# Patient Record
Sex: Male | Born: 1990 | Race: White | Hispanic: No | Marital: Single | Smoking: Current every day smoker
Health system: Southern US, Community
[De-identification: ages and names within clinical notes are randomized; demographics above are authoritative.]

---

## 2010-10-01 ENCOUNTER — Emergency Department: Payer: Self-pay | Admitting: Emergency Medicine

## 2011-04-15 ENCOUNTER — Ambulatory Visit: Payer: Self-pay

## 2017-07-04 ENCOUNTER — Emergency Department
Admission: EM | Admit: 2017-07-04 | Discharge: 2017-07-04 | Disposition: A | Discharge status: Home / Self Care | Payer: Medicaid Other | Attending: Emergency Medicine | Admitting: Emergency Medicine

## 2017-07-04 ENCOUNTER — Emergency Department: Payer: Medicaid Other

## 2017-07-04 ENCOUNTER — Other Ambulatory Visit: Payer: Self-pay

## 2017-07-04 ENCOUNTER — Encounter: Payer: Self-pay | Admitting: Emergency Medicine

## 2017-07-04 DIAGNOSIS — Z23 Encounter for immunization: Secondary | ICD-10-CM | POA: Insufficient documentation

## 2017-07-04 DIAGNOSIS — F17210 Nicotine dependence, cigarettes, uncomplicated: Secondary | ICD-10-CM | POA: Insufficient documentation

## 2017-07-04 DIAGNOSIS — Y929 Unspecified place or not applicable: Secondary | ICD-10-CM | POA: Insufficient documentation

## 2017-07-04 DIAGNOSIS — S61310A Laceration without foreign body of right index finger with damage to nail, initial encounter: Secondary | ICD-10-CM | POA: Diagnosis present

## 2017-07-04 DIAGNOSIS — W230XXA Caught, crushed, jammed, or pinched between moving objects, initial encounter: Secondary | ICD-10-CM | POA: Insufficient documentation

## 2017-07-04 DIAGNOSIS — Y939 Activity, unspecified: Secondary | ICD-10-CM | POA: Insufficient documentation

## 2017-07-04 DIAGNOSIS — S61319A Laceration without foreign body of unspecified finger with damage to nail, initial encounter: Secondary | ICD-10-CM

## 2017-07-04 DIAGNOSIS — Y998 Other external cause status: Secondary | ICD-10-CM | POA: Diagnosis not present

## 2017-07-04 DIAGNOSIS — S61309A Unspecified open wound of unspecified finger with damage to nail, initial encounter: Secondary | ICD-10-CM

## 2017-07-04 MED ORDER — TETANUS-DIPHTH-ACELL PERTUSSIS 5-2.5-18.5 LF-MCG/0.5 IM SUSP
0.5000 mL | Freq: Once | INTRAMUSCULAR | Status: AC
Start: 2017-07-04 — End: 2017-07-04
  Administered 2017-07-04: 0.5 mL via INTRAMUSCULAR
  Filled 2017-07-04: qty 0.5

## 2017-07-04 MED ORDER — HYDROCODONE-ACETAMINOPHEN 5-325 MG PO TABS
1.0000 | ORAL_TABLET | Freq: Four times a day (QID) | ORAL | 0 refills | Status: DC | PRN
Start: 2017-07-04 — End: 2019-10-21

## 2017-07-04 MED ORDER — PENTAFLUOROPROP-TETRAFLUOROETH EX AERO: 1.0000 "application " | INHALATION_SPRAY | CUTANEOUS | Status: DC | PRN

## 2017-07-04 MED ORDER — LIDOCAINE VISCOUS 2 % MT SOLN
15.0000 mL | Freq: Once | OROMUCOSAL | Status: AC
  Administered 2017-07-04: 15 mL via OROMUCOSAL
  Filled 2017-07-04: qty 15

## 2017-07-04 MED ORDER — LIDOCAINE HCL (PF) 1 % IJ SOLN
5.0000 mL | Freq: Once | INTRAMUSCULAR | Status: AC
  Administered 2017-07-04: 5 mL
  Filled 2017-07-04: qty 5

## 2017-07-04 MED ORDER — HYDROCODONE-ACETAMINOPHEN 5-325 MG PO TABS
1.0000 | ORAL_TABLET | Freq: Once | ORAL | Status: AC
Start: 2017-07-04 — End: 2017-07-04
  Administered 2017-07-04: 1 via ORAL
  Filled 2017-07-04: qty 1

## 2017-07-04 NOTE — ED Triage Notes (Signed)
Pt with right finger # 2 injury. Bleeding controlled.
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
## 2017-07-04 NOTE — Discharge Instructions (Signed)
You have suffered a traumatic nail avulsion and nailbed injury. Keep the wound & dressing clean and dry. Wash only with soap & water. Change the dressing as needed. Take the pain medicine as needed. [facility] or return as needed.
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
## 2019-10-21 ENCOUNTER — Other Ambulatory Visit: Payer: Self-pay

## 2019-10-21 ENCOUNTER — Emergency Department: Payer: Medicaid Other

## 2019-10-21 ENCOUNTER — Emergency Department
Admission: EM | Admit: 2019-10-21 | Discharge: 2019-10-21 | Disposition: A | Discharge status: Home / Self Care | Payer: Medicaid Other | Attending: Emergency Medicine | Admitting: Emergency Medicine

## 2019-10-21 ENCOUNTER — Encounter: Payer: Self-pay | Admitting: Emergency Medicine

## 2019-10-21 DIAGNOSIS — Y999 Unspecified external cause status: Secondary | ICD-10-CM | POA: Insufficient documentation

## 2019-10-21 DIAGNOSIS — S0992XA Unspecified injury of nose, initial encounter: Secondary | ICD-10-CM | POA: Diagnosis present

## 2019-10-21 DIAGNOSIS — F17210 Nicotine dependence, cigarettes, uncomplicated: Secondary | ICD-10-CM | POA: Insufficient documentation

## 2019-10-21 DIAGNOSIS — S022XXA Fracture of nasal bones, initial encounter for closed fracture: Secondary | ICD-10-CM | POA: Diagnosis not present

## 2019-10-21 DIAGNOSIS — Y929 Unspecified place or not applicable: Secondary | ICD-10-CM | POA: Diagnosis not present

## 2019-10-21 DIAGNOSIS — Z23 Encounter for immunization: Secondary | ICD-10-CM | POA: Insufficient documentation

## 2019-10-21 DIAGNOSIS — Y939 Activity, unspecified: Secondary | ICD-10-CM | POA: Insufficient documentation

## 2019-10-21 DIAGNOSIS — T07XXXA Unspecified multiple injuries, initial encounter: Secondary | ICD-10-CM

## 2019-10-21 MED ORDER — METHOCARBAMOL 500 MG PO TABS: 500.0000 mg | ORAL_TABLET | Freq: Three times a day (TID) | ORAL | 0 refills | Status: AC | PRN

## 2019-10-21 MED ORDER — TETANUS-DIPHTH-ACELL PERTUSSIS 5-2.5-18.5 LF-MCG/0.5 IM SUSP
0.5000 mL | Freq: Once | INTRAMUSCULAR | Status: AC
  Administered 2019-10-21: 14:00:00 0.5 mL via INTRAMUSCULAR
  Filled 2019-10-21: qty 0.5

## 2019-10-21 MED ORDER — AMOXICILLIN 875 MG PO TABS: 875.0000 mg | ORAL_TABLET | Freq: Two times a day (BID) | ORAL | 0 refills | Status: DC

## 2019-10-21 MED ORDER — NAPROXEN 500 MG PO TABS: 500.0000 mg | ORAL_TABLET | Freq: Two times a day (BID) | ORAL | 0 refills | Status: DC

## 2019-10-21 MED ORDER — KETOROLAC TROMETHAMINE 60 MG/2ML IM SOLN
15.0000 mg | Freq: Once | INTRAMUSCULAR | Status: AC
  Administered 2019-10-21: 14:00:00 15 mg via INTRAMUSCULAR
  Filled 2019-10-21: qty 2
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
## 2019-10-21 NOTE — Discharge Instructions (Signed)
Your CT scans show nasal bone fractures.  You should take amoxicillin 2 times a day to protect against infection while this starts to heal.  Follow-up with ENT in 1 week for further evaluation.  There are no other serious injuries, and your x-rays of the low back and left hand do not show any broken bones.
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
## 2019-10-22 ENCOUNTER — Emergency Department
Admission: EM | Admit: 2019-10-22 | Discharge: 2019-10-22 | Disposition: A | Discharge status: Home / Self Care | Payer: Medicaid Other | Attending: Emergency Medicine | Admitting: Emergency Medicine

## 2019-10-22 ENCOUNTER — Other Ambulatory Visit: Payer: Self-pay

## 2019-10-22 ENCOUNTER — Encounter: Payer: Self-pay | Admitting: Emergency Medicine

## 2019-10-22 DIAGNOSIS — S51011A Laceration without foreign body of right elbow, initial encounter: Secondary | ICD-10-CM | POA: Diagnosis not present

## 2019-10-22 DIAGNOSIS — S59911A Unspecified injury of right forearm, initial encounter: Secondary | ICD-10-CM | POA: Diagnosis present

## 2019-10-22 DIAGNOSIS — F17210 Nicotine dependence, cigarettes, uncomplicated: Secondary | ICD-10-CM | POA: Diagnosis not present

## 2019-10-22 DIAGNOSIS — S51811A Laceration without foreign body of right forearm, initial encounter: Secondary | ICD-10-CM

## 2019-10-22 DIAGNOSIS — Y999 Unspecified external cause status: Secondary | ICD-10-CM | POA: Diagnosis not present

## 2019-10-22 DIAGNOSIS — W25XXXA Contact with sharp glass, initial encounter: Secondary | ICD-10-CM | POA: Diagnosis not present

## 2019-10-22 DIAGNOSIS — Y939 Activity, unspecified: Secondary | ICD-10-CM | POA: Insufficient documentation

## 2019-10-22 DIAGNOSIS — Y92009 Unspecified place in unspecified non-institutional (private) residence as the place of occurrence of the external cause: Secondary | ICD-10-CM | POA: Diagnosis not present

## 2019-10-22 MED ORDER — LIDOCAINE HCL (PF) 1 % IJ SOLN
5.0000 mL | Freq: Once | INTRAMUSCULAR | Status: AC
  Administered 2019-10-22: 11:00:00 5 mL via INTRADERMAL
  Filled 2019-10-22: qty 5

## 2019-10-22 MED ORDER — CEPHALEXIN 500 MG PO CAPS: 500.0000 mg | ORAL_CAPSULE | Freq: Four times a day (QID) | ORAL | 0 refills | Status: AC

## 2019-10-22 MED ORDER — CEPHALEXIN 500 MG PO CAPS
500.0000 mg | ORAL_CAPSULE | Freq: Once | ORAL | Status: AC
  Administered 2019-10-22: 12:00:00 500 mg via ORAL
  Filled 2019-10-22: qty 1

## 2019-10-22 MED ORDER — KETOROLAC TROMETHAMINE 30 MG/ML IJ SOLN
15.0000 mg | Freq: Once | INTRAMUSCULAR | Status: AC
  Administered 2019-10-22: 11:00:00 15 mg via INTRAMUSCULAR
  Filled 2019-10-22: qty 1

## 2019-10-22 MED ORDER — SULFAMETHOXAZOLE-TRIMETHOPRIM 800-160 MG PO TABS
1.0000 | ORAL_TABLET | Freq: Once | ORAL | Status: DC
  Filled 2019-10-22: qty 1
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
## 2019-10-22 NOTE — Discharge Instructions (Signed)
Please begin Keflex today to prevent infection.  Do not pick up the amoxicillin that you were prescribed yesterday.  Keep laceration covered.  Do not get sutures wet for 24 hours.  Sutures need to be removed in 7 to 10 days.
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
## 2020-06-01 ENCOUNTER — Ambulatory Visit
Admission: EM | Admit: 2020-06-01 | Discharge: 2020-06-01 | Disposition: A | Discharge status: Other Acute Inpatient Hospital | Payer: Medicaid Other

## 2020-06-01 ENCOUNTER — Other Ambulatory Visit: Payer: Self-pay

## 2020-06-01 DIAGNOSIS — K047 Periapical abscess without sinus: Secondary | ICD-10-CM | POA: Diagnosis not present

## 2020-06-01 DIAGNOSIS — S025XXB Fracture of tooth (traumatic), initial encounter for open fracture: Secondary | ICD-10-CM

## 2020-06-01 NOTE — ED Provider Notes (Signed)
[facility]    CSN: [id number] Arrival date & time: 06/01/20  1408      History   Chief Complaint Chief Complaint  Patient presents with  . Dental Pain    HPI Eric Chen is a 30 y.o. male.   HPI   30 year old male here for evaluation of dental pain and swelling to the left side of his face.  Patient reports that he developed swelling 2 nights ago.  He also reports that that is when his pain increased.  Patient does have multiple broken teeth on his left lower jaw that have been broken for some time.  Patient denies fever.  Patient is crying in the exam room.  History reviewed. No pertinent past medical history.  There are no problems to display for this patient.   History reviewed. No pertinent surgical history.     Home Medications    Prior to Admission medications   Not on File    Family History History reviewed. No pertinent family history.  Social History Social History   Tobacco Use  . Smoking status: Current Every Day Smoker    Types: Cigarettes  . Smokeless tobacco: Former Clinical biochemist  . Vaping Use: Never used  Substance Use Topics  . Alcohol use: No     Allergies   Patient has no known allergies.   Review of Systems Review of Systems  Constitutional: Negative for activity change, appetite change and fever.  HENT: Positive for dental problem and ear pain.   Skin: Negative for rash and wound.  Hematological: Negative.   Psychiatric/Behavioral: Negative.      Physical Exam Triage Vital Signs ED Triage Vitals  Enc Vitals Group     BP 06/01/20 1434 (!) 156/102     Pulse Rate 06/01/20 1434 (!) 105     Resp 06/01/20 1434 18     Temp 06/01/20 1434 99.1 F (37.3 C)     Temp Source 06/01/20 1434 Oral     SpO2 06/01/20 1434 100 %     Weight 06/01/20 1432 148 lb (67.1 kg)     Height 06/01/20 1432 6\' 2"  (1.88 m)     Head Circumference --      Peak Flow --      Pain Score 06/01/20 1432 10     Pain Loc --      Pain  Edu? --      Excl. in GC? --    No data found.  Updated Vital Signs BP (!) 156/102 (BP Location: Left Arm)   Pulse (!) 105   Temp 99.1 F (37.3 C) (Oral)   Resp 18   Ht 6\' 2"  (1.88 m)   Wt 148 lb (67.1 kg)   SpO2 100%   BMI 19.00 kg/m   Visual Acuity Right Eye Distance:   Left Eye Distance:   Bilateral Distance:    Right Eye Near:   Left Eye Near:    Bilateral Near:     Physical Exam Vitals and nursing note reviewed.  Constitutional:      General: He is in acute distress.     Appearance: Normal appearance. He is not toxic-appearing.  HENT:     Head: Normocephalic and atraumatic.     Mouth/Throat:     Mouth: Mucous membranes are moist.     Pharynx: Posterior oropharyngeal erythema present.  Skin:    General: Skin is warm and dry.     Capillary Refill: Capillary refill takes less than  2 seconds.  Neurological:     General: No focal deficit present.     Mental Status: He is alert and oriented to person, place, and time.  Psychiatric:        Mood and Affect: Mood normal.        Behavior: Behavior normal.        Thought Content: Thought content normal.        Judgment: Judgment normal.      UC Treatments / Results  Labs (all labs ordered are listed, but only abnormal results are displayed) Labs Reviewed - No data to display  EKG   Radiology No results found.  Procedures Procedures (including critical care time)  Medications Ordered in UC Medications - No data to display  Initial Impression / Assessment and Plan / UC Course  I have reviewed the triage vital signs and the nursing notes.  Pertinent labs & imaging results that were available during my care of the patient were reviewed by me and considered in my medical decision making (see chart for details).   Is here for evaluation of swelling to the left side of his face on the lateral aspect of the posterior mandible anterior to the angle.  Patient is with a molar on the lower left is fractured and  eroded.  The tooth bed is yellow.  There other broken teeth anterior to that tooth as well.  Patient has bogginess to the floor of his mouth and is tender to palpation.  There is erythema to the surrounding gum tissue but no discharge.  The swelling on the outside of the jaw is hard but not indurated or fluctuant.  Concern for infection has asked spread to the mandible.  For this reason I have recommended patient go to the ER at [facility] to be evaluated by dental and oral maxillofacial surgery.   Final Clinical Impressions(s) / UC Diagnoses   Final diagnoses:  Dental infection  Open fracture of tooth, initial encounter     Discharge Instructions     Due to the swelling, pain, and degree of dental decay present there is concern that the infection has spread to your jaw bone.  For that reason I have suggested you go to the emergency department at [facility], as we discussed, to be evaluated by the on-call dentist.    ED Prescriptions    None     I have reviewed the PDMP during this encounter.   Becky Augusta, NP 06/01/20 [phone]

## 2020-06-01 NOTE — ED Triage Notes (Signed)
Patient complains of multiple broken lower left teeth since Saturday. States that are has been very sore and worsening with swelling.

## 2020-06-01 NOTE — Discharge Instructions (Addendum)
Due to the swelling, pain, and degree of dental decay present there is concern that the infection has spread to your jaw bone.  For that reason I have suggested you go to the emergency department at [facility], as we discussed, to be evaluated by the on-call dentist.

## 2021-01-14 ENCOUNTER — Emergency Department (HOSPITAL_COMMUNITY)
Admission: EM | Admit: 2021-01-14 | Discharge: 2021-01-23 | Disposition: E | Payer: Medicaid Other | Attending: Emergency Medicine | Admitting: Emergency Medicine

## 2021-01-14 DIAGNOSIS — S2191XA Laceration without foreign body of unspecified part of thorax, initial encounter: Secondary | ICD-10-CM | POA: Insufficient documentation

## 2021-01-14 DIAGNOSIS — Y92410 Unspecified street and highway as the place of occurrence of the external cause: Secondary | ICD-10-CM | POA: Insufficient documentation

## 2021-01-14 DIAGNOSIS — I468 Cardiac arrest due to other underlying condition: Secondary | ICD-10-CM

## 2021-01-14 DIAGNOSIS — I469 Cardiac arrest, cause unspecified: Secondary | ICD-10-CM | POA: Insufficient documentation

## 2021-01-14 DIAGNOSIS — S02413A LeFort III fracture, initial encounter for closed fracture: Secondary | ICD-10-CM

## 2021-01-14 DIAGNOSIS — Z87891 Personal history of nicotine dependence: Secondary | ICD-10-CM | POA: Insufficient documentation

## 2021-01-14 DIAGNOSIS — S301XXA Contusion of abdominal wall, initial encounter: Secondary | ICD-10-CM | POA: Insufficient documentation

## 2021-01-14 DIAGNOSIS — S7292XA Unspecified fracture of left femur, initial encounter for closed fracture: Secondary | ICD-10-CM
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
## 2021-01-23 DEATH — deceased
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
